# Patient Record
Sex: Male | Born: 1950 | Race: White | Hispanic: No
Health system: Southern US, Community
[De-identification: ages and names within clinical notes are randomized; demographics above are authoritative.]

---

## 2006-08-13 ENCOUNTER — Encounter: Admission: RE | Admit: 2006-08-13 | Discharge: 2006-08-13 | Payer: Self-pay | Admitting: Family Medicine

## 2008-09-24 IMAGING — US US SCROTUM
1 series · 13 of 25 positions shown · non-contrast
Comparison: None.

CLINICAL DATA: Right-sided testicular lump.
 SCROTAL ULTRASOUND:
 DOPPLER ULTRASOUND OF THE TESTICLES:
TECHNIQUE: Complete ultrasound examination of the testicles, epididymis, and other scrotal structures was performed.  Color and spectral Doppler ultrasound were also utilized to evaluate blood flow to the testicles.

[Series 1: unknown · 0.09mm/px · 13 of 46 slices shown]
[im 1/46]
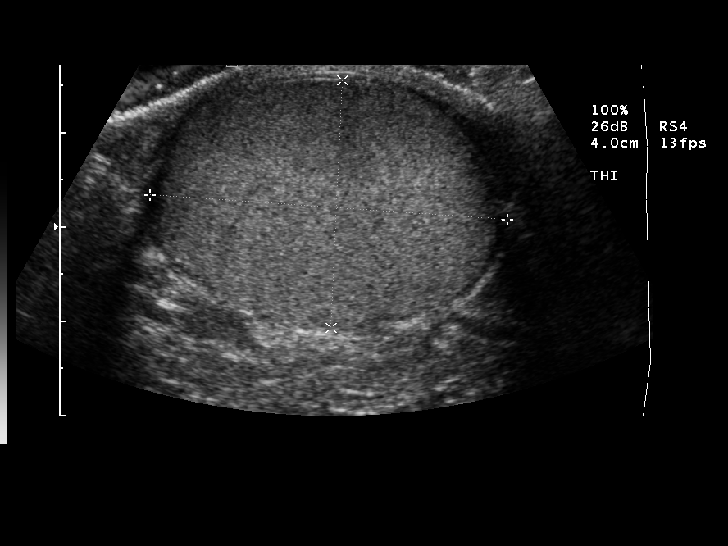
[im 4/46]
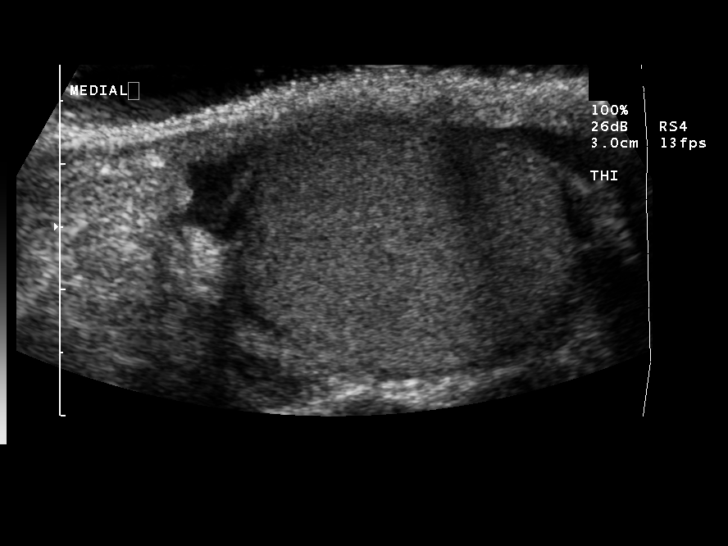
[im 8/46]
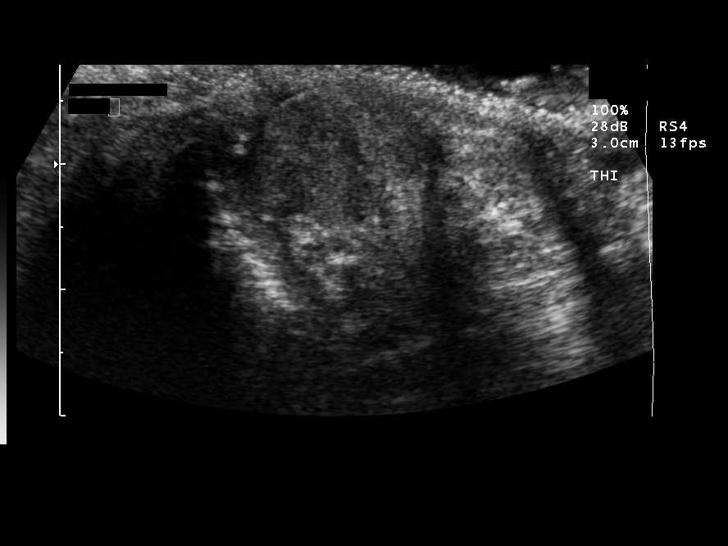
[im 12/46]
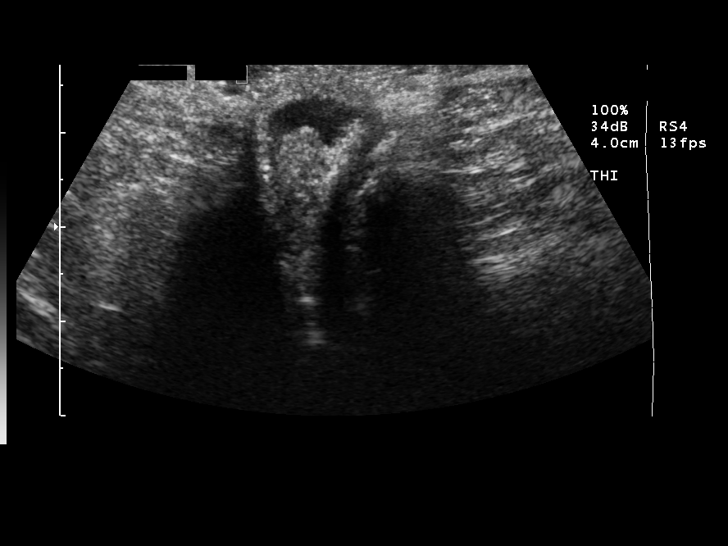
[im 16/46]
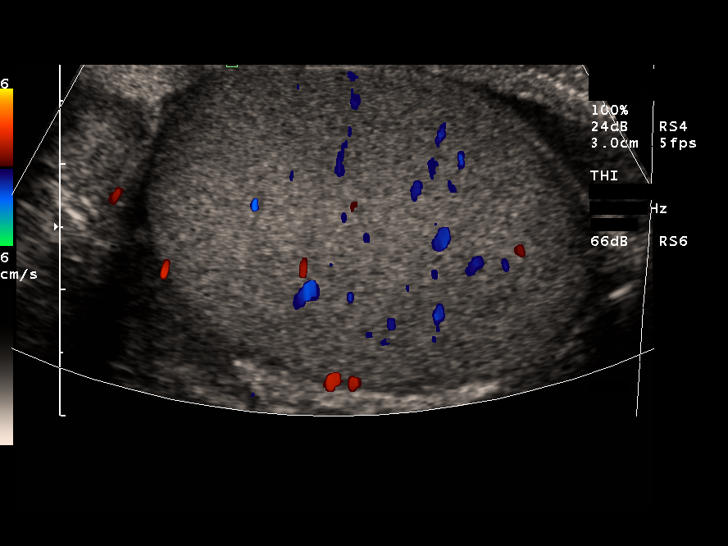
[im 19/46]
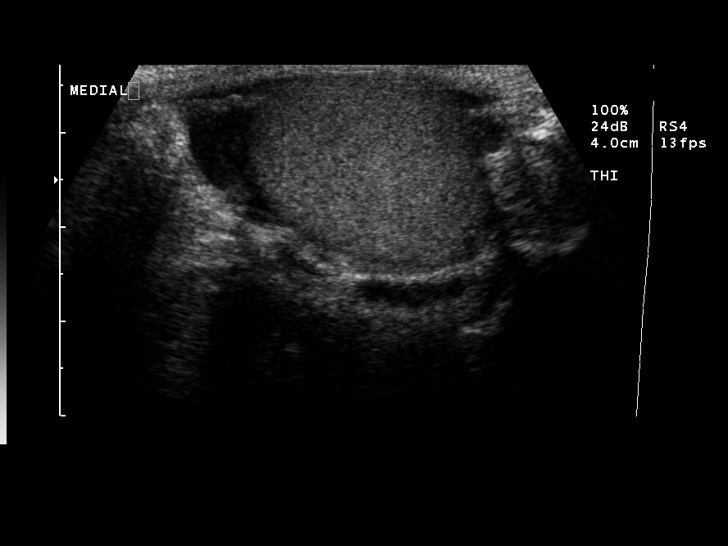
[im 23/46]
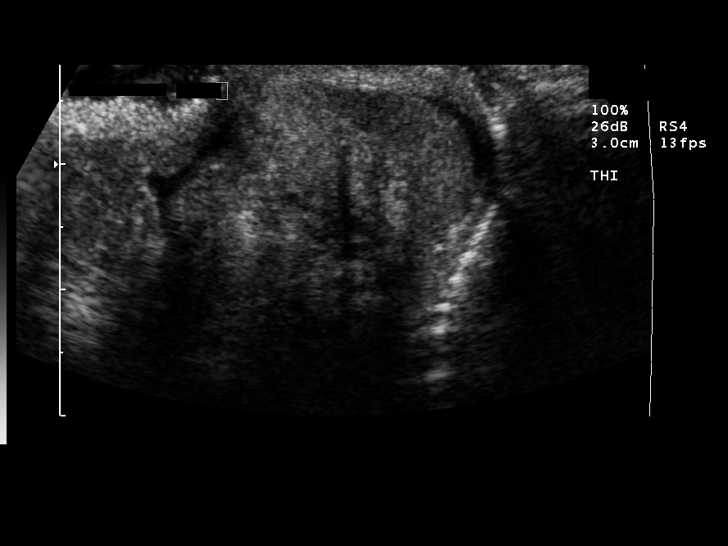
[im 27/46]
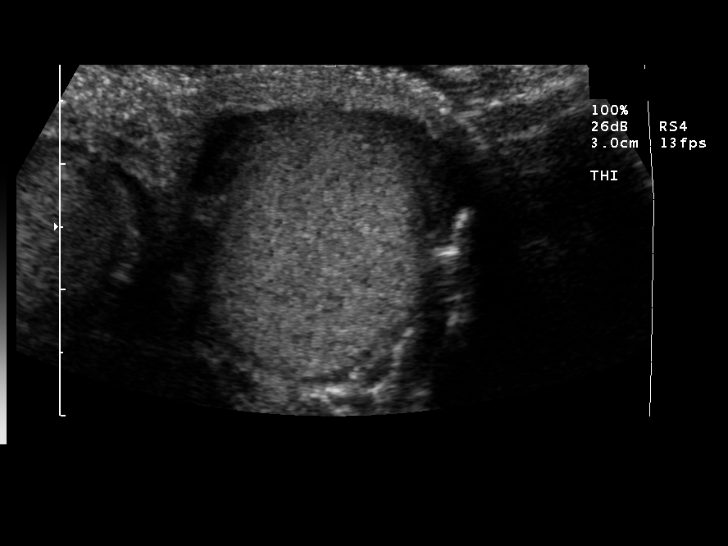
[im 31/46]
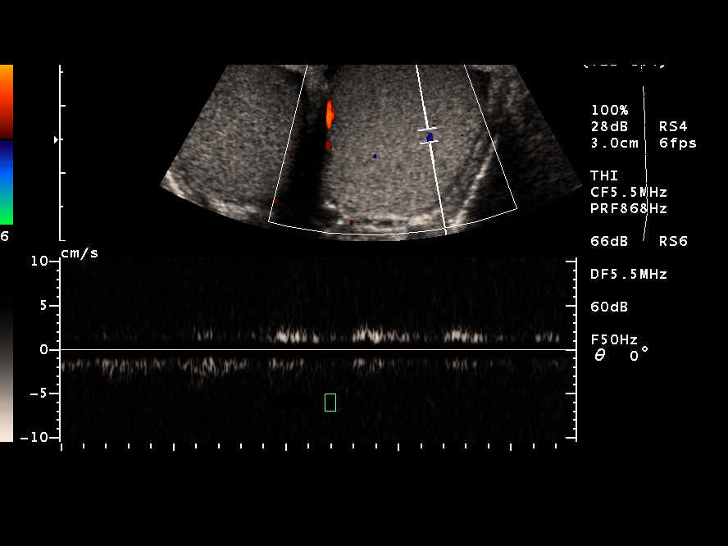
[im 34/46]
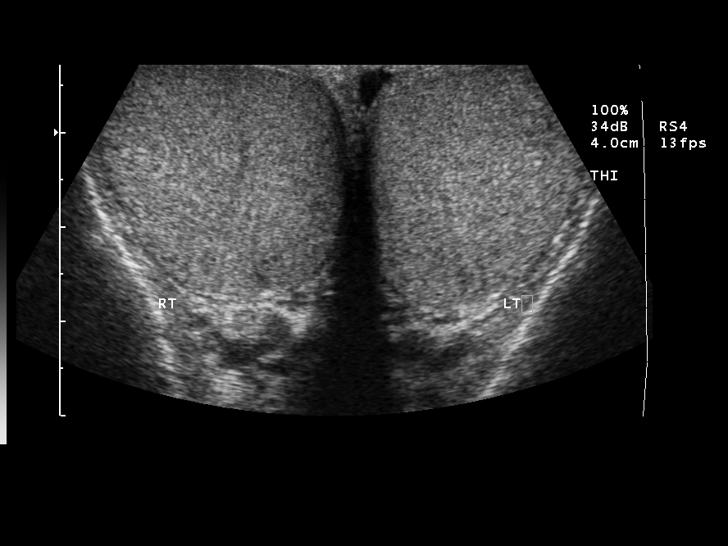
[im 38/46]
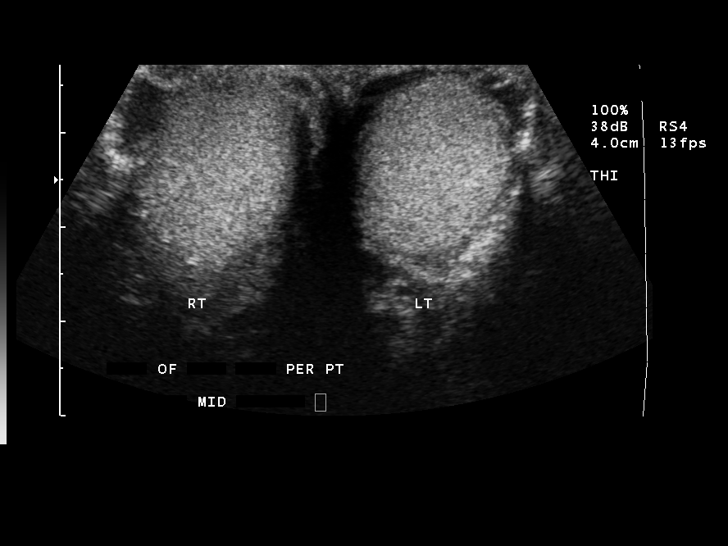
[im 42/46]
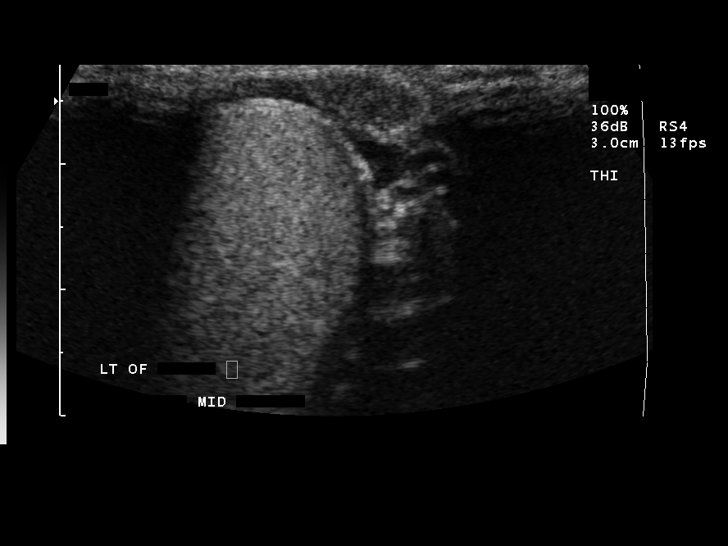
[im 46/46]
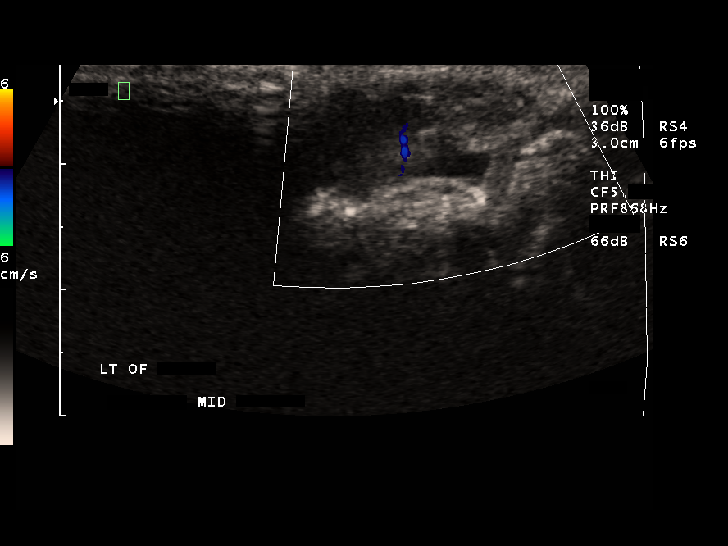

[13 of 25 positions shown; findings below may reference images not displayed]

FINDINGS: The right testicle is 3.8 x 2.6 x 2.4 cm.  The left testicle is 3.6 x 2.4 x 2.3 cm.  No testicular mass.  Normal color spectral tracings to bilateral testes.  
 The epididymides are normal.  Trace likely physiologic hydroceles bilaterally.  
 No evidence of varicocele. 
 Corresponding to the point of clinical concern, is an area of soft tissue thickening involving the scrotum measuring approximately 9 x 4 x 8 mm.  This is positioned just left of midline in the inferior posterior aspect of the scrotum.  This is separate from the testicle and epididymis.
IMPRESSION: 1.  There is no evidence of testicular or epididymal abnormality.
 2.  The palpable abnormality corresponds to an area of focal scrotal thickening/nodularity measuring maximally 9 mm.  Consider clinical and/or ultrasound surveillance to confirm size stability.  This may also be amenable to direct tissue sampling if indicated.

## 2009-01-30 ENCOUNTER — Encounter: Admission: RE | Admit: 2009-01-30 | Discharge: 2009-01-30 | Payer: Self-pay | Admitting: Family Medicine

## 2011-03-14 IMAGING — CR DG ANKLE COMPLETE 3+V*L*
3 series · 3 of 3 positions shown · non-contrast
Comparison: None.

CLINICAL DATA: Distal fibular pain.  Knot and swelling.

LEFT ANKLE COMPLETE - 3+ VIEW

[t ankle joint ap left]
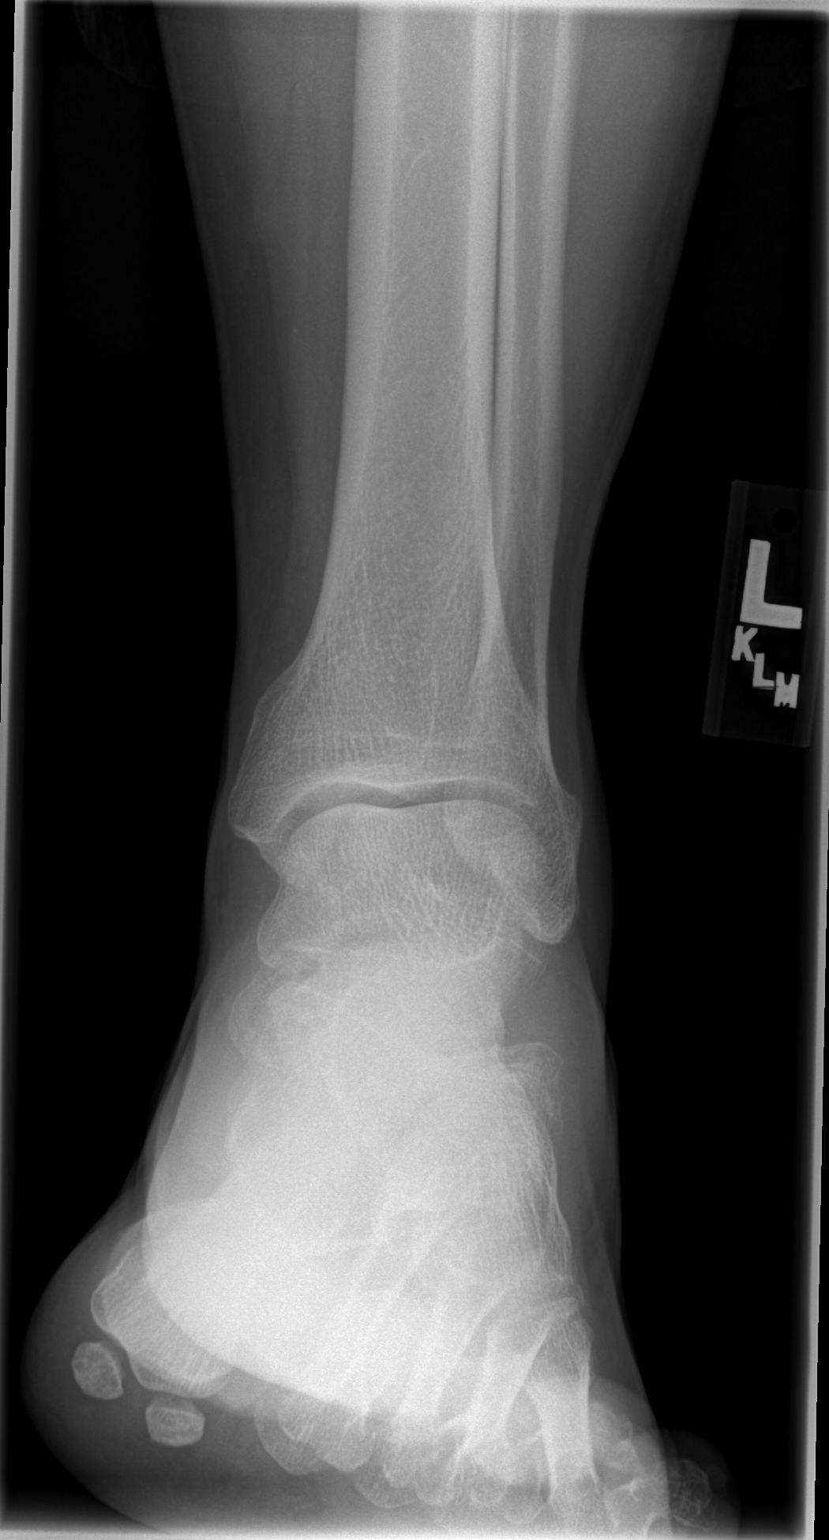

[t ankle joint oblique left]
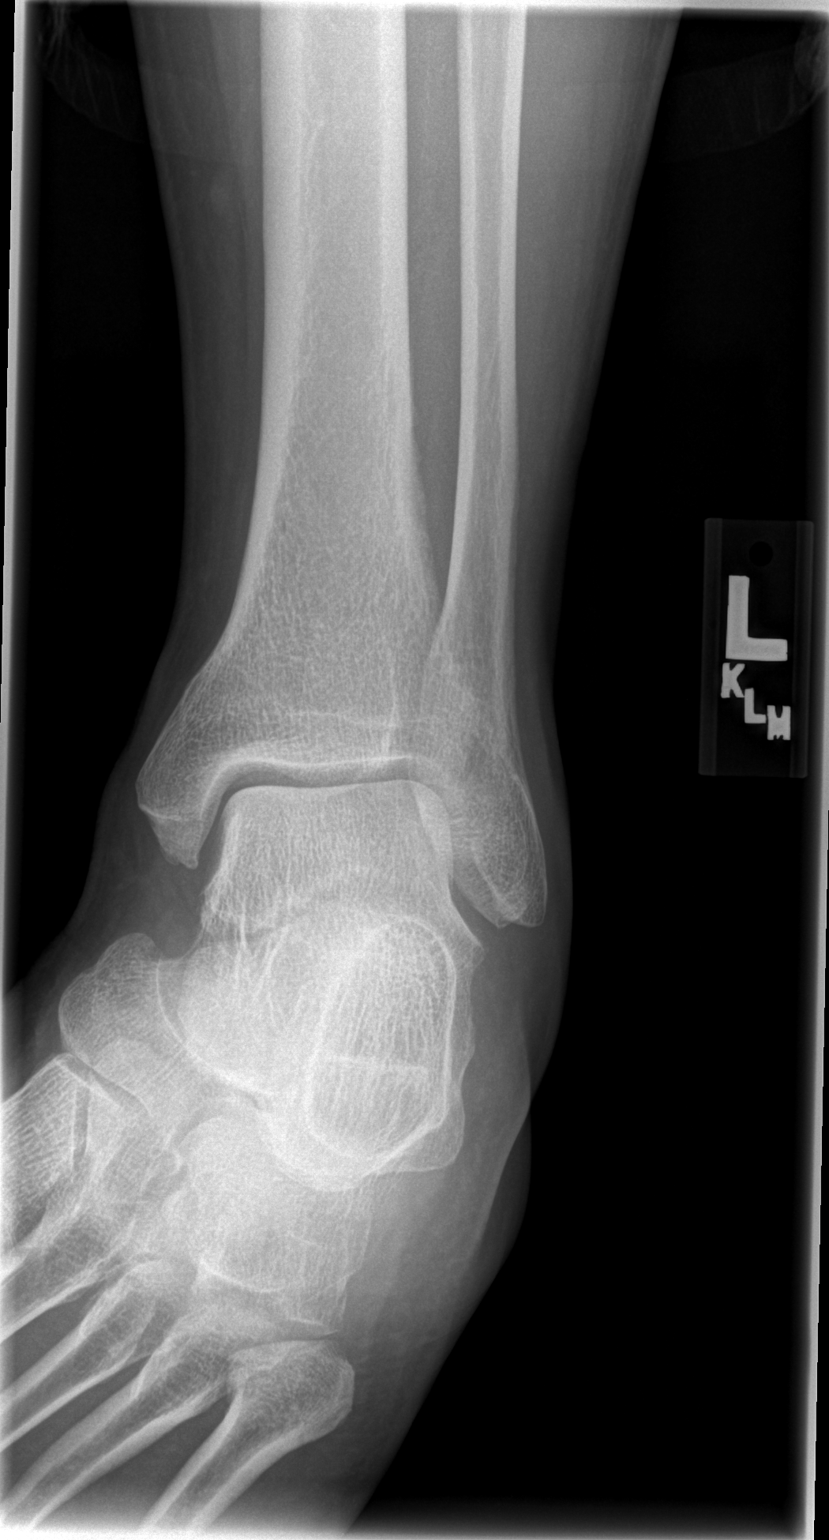

[t ankle joint lat left]
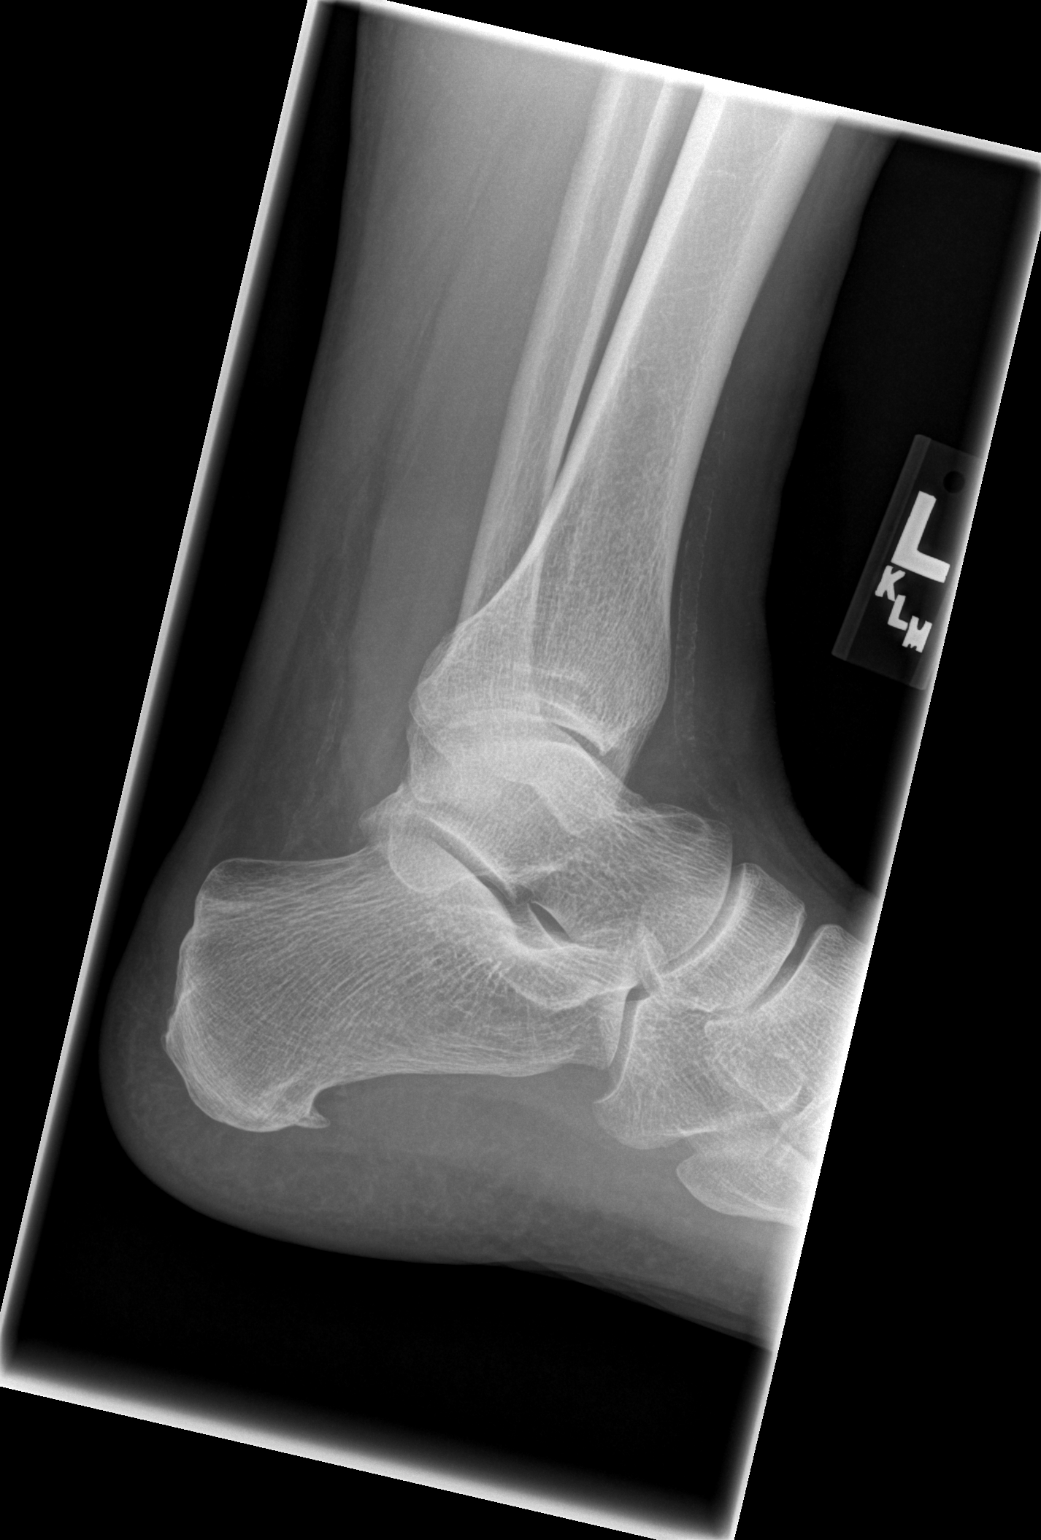

[3 of 3 positions shown; findings below may reference images not displayed]

FINDINGS: There is a small osseous protuberance off the lateral
malleolus,.  No acute fracture.  Calcaneal spur.  Vascular
calcifications.  Mild degenerative changes at the tibiotalar joint.
IMPRESSION: Tiny osseous protuberance off the lateral malleolus may represent a
small exostosis.  No acute osseous abnormality.

## 2020-01-09 ENCOUNTER — Telehealth: Payer: Self-pay | Admitting: Adult Health

## 2020-01-09 NOTE — Telephone Encounter (Signed)
Called and discussed monoclonal antibody therapy for COVID 19.  His symptom onset was on 8/4.  He meets criteria due to his age.  I reviewed with him and he would like to think about it.  Call back number given [phone].  Lillard Anes, NP

## 2021-01-17 DIAGNOSIS — Z79899 Other long term (current) drug therapy: Secondary | ICD-10-CM | POA: Diagnosis not present

## 2021-01-17 DIAGNOSIS — Z1389 Encounter for screening for other disorder: Secondary | ICD-10-CM | POA: Diagnosis not present

## 2021-01-17 DIAGNOSIS — M545 Low back pain, unspecified: Secondary | ICD-10-CM | POA: Diagnosis not present

## 2021-01-17 DIAGNOSIS — Z0000 Encounter for general adult medical examination without abnormal findings: Secondary | ICD-10-CM | POA: Diagnosis not present

## 2021-01-17 DIAGNOSIS — Z23 Encounter for immunization: Secondary | ICD-10-CM | POA: Diagnosis not present

## 2021-01-17 DIAGNOSIS — E7800 Pure hypercholesterolemia, unspecified: Secondary | ICD-10-CM | POA: Diagnosis not present

## 2021-01-17 DIAGNOSIS — R7303 Prediabetes: Secondary | ICD-10-CM | POA: Diagnosis not present

## 2022-01-21 DIAGNOSIS — Z79899 Other long term (current) drug therapy: Secondary | ICD-10-CM | POA: Diagnosis not present

## 2022-01-21 DIAGNOSIS — Z0000 Encounter for general adult medical examination without abnormal findings: Secondary | ICD-10-CM | POA: Diagnosis not present

## 2022-01-21 DIAGNOSIS — M545 Low back pain, unspecified: Secondary | ICD-10-CM | POA: Diagnosis not present

## 2022-01-21 DIAGNOSIS — E7800 Pure hypercholesterolemia, unspecified: Secondary | ICD-10-CM | POA: Diagnosis not present

## 2022-01-21 DIAGNOSIS — R7303 Prediabetes: Secondary | ICD-10-CM | POA: Diagnosis not present

## 2022-05-16 DIAGNOSIS — R21 Rash and other nonspecific skin eruption: Secondary | ICD-10-CM | POA: Diagnosis not present

## 2023-01-23 DIAGNOSIS — R21 Rash and other nonspecific skin eruption: Secondary | ICD-10-CM | POA: Diagnosis not present

## 2023-01-23 DIAGNOSIS — Z0000 Encounter for general adult medical examination without abnormal findings: Secondary | ICD-10-CM | POA: Diagnosis not present

## 2023-01-23 DIAGNOSIS — E7800 Pure hypercholesterolemia, unspecified: Secondary | ICD-10-CM | POA: Diagnosis not present

## 2023-01-23 DIAGNOSIS — Z79899 Other long term (current) drug therapy: Secondary | ICD-10-CM | POA: Diagnosis not present

## 2023-01-23 DIAGNOSIS — R7303 Prediabetes: Secondary | ICD-10-CM | POA: Diagnosis not present

## 2023-02-18 DIAGNOSIS — L304 Erythema intertrigo: Secondary | ICD-10-CM | POA: Diagnosis not present

## 2023-02-18 DIAGNOSIS — L258 Unspecified contact dermatitis due to other agents: Secondary | ICD-10-CM | POA: Diagnosis not present
